# Patient Record
Sex: Female | Born: 2000 | Hispanic: Yes | Marital: Single
Health system: Southern US, Community
[De-identification: ages and names within clinical notes are randomized; demographics above are authoritative.]

---
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
## 2021-01-27 IMAGING — US US PELVIS COMPLETE WITH TRANSVAGINAL
1 series · 13 of 25 positions shown · non-contrast
Comparison: None available.

CLINICAL DATA: Initial evaluation for irregular menses with pelvic
pain for 2 months.



[Series 1: us pelvis complete with transvaginal · 13 of 73 slices shown]
[im 1/73]
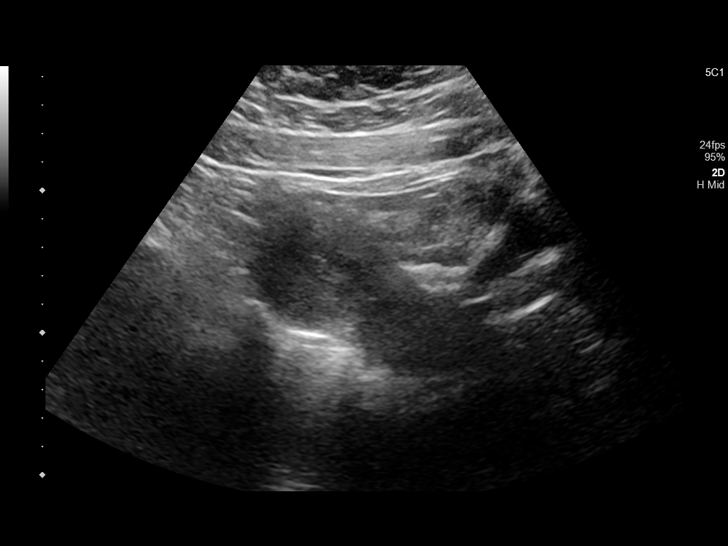
[im 7/73]
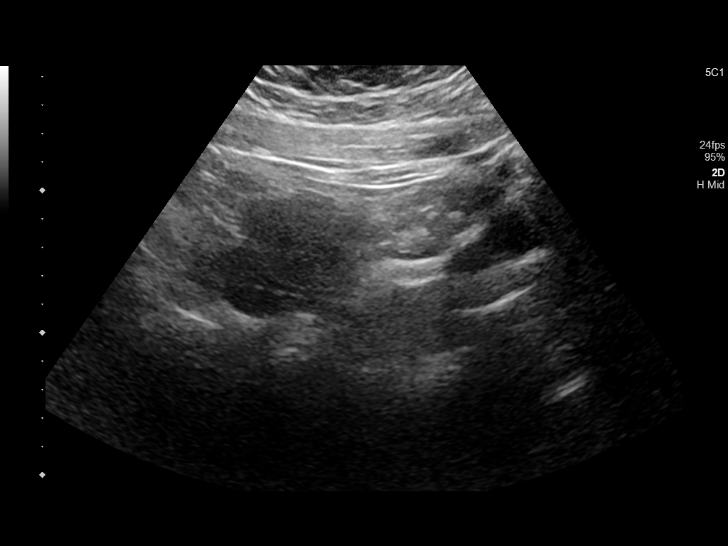
[im 13/73]
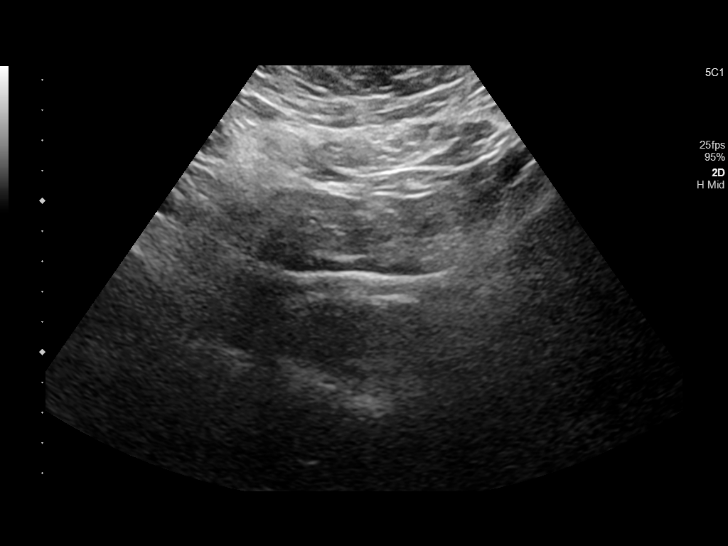
[im 19/73]
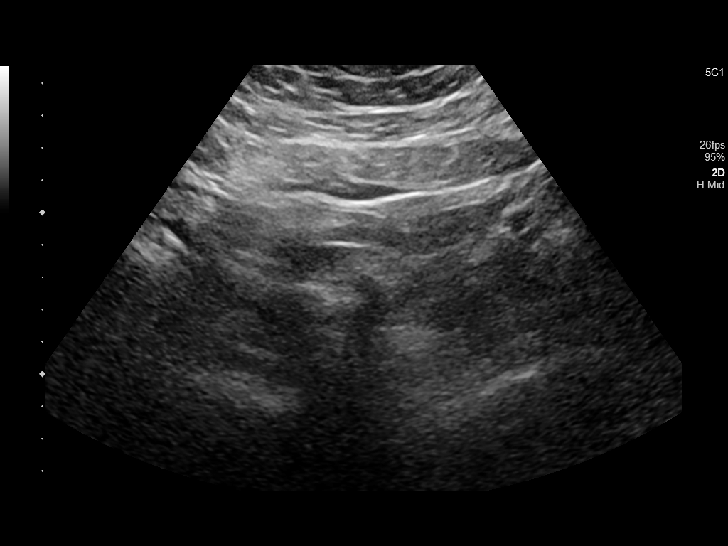
[im 25/73]
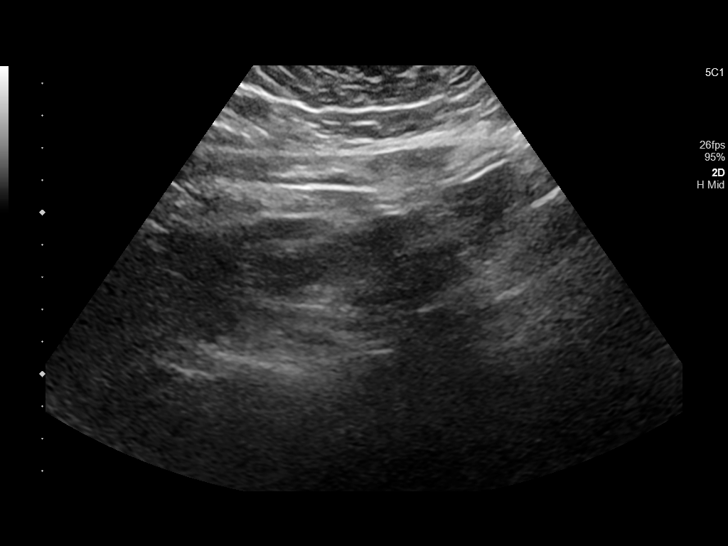
[im 31/73]
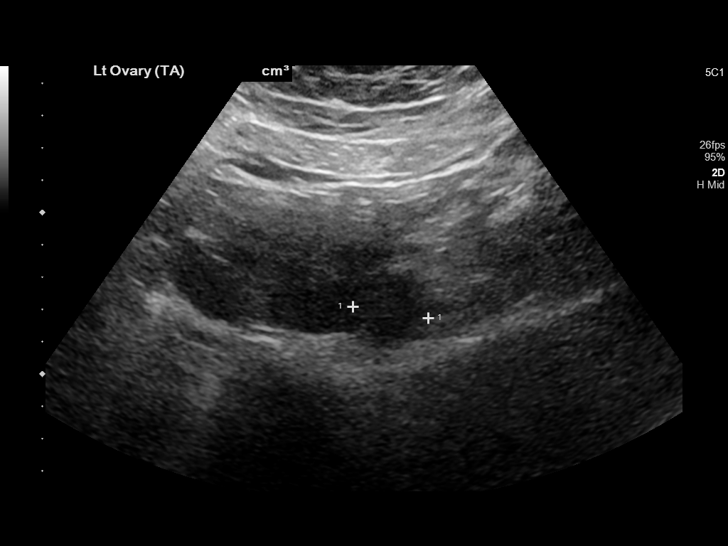
[im 37/73]
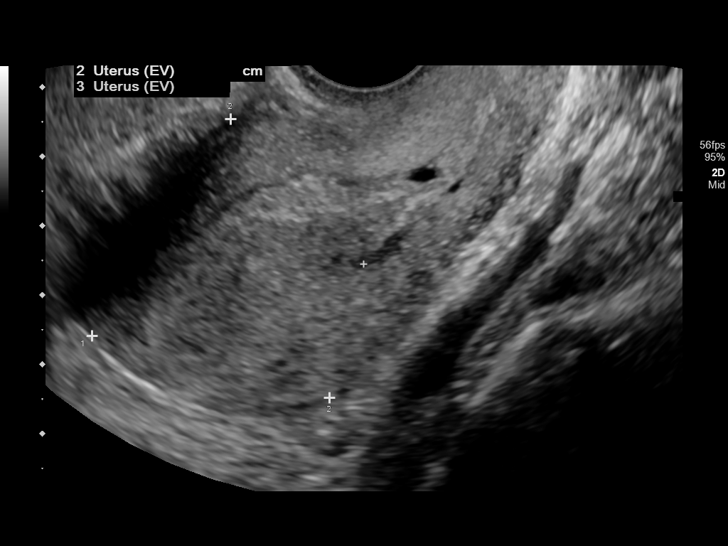
[im 43/73]
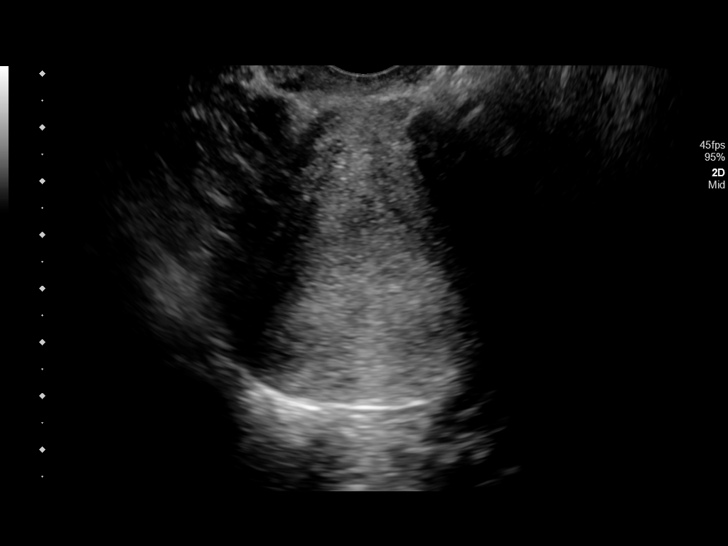
[im 49/73]
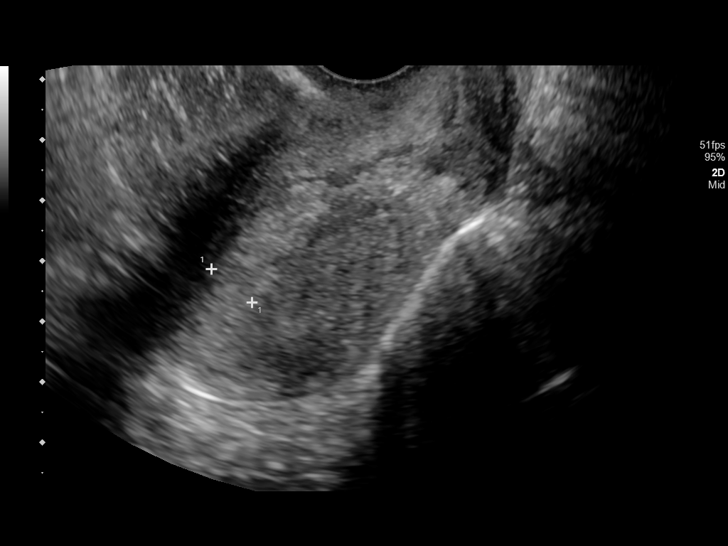
[im 55/73]
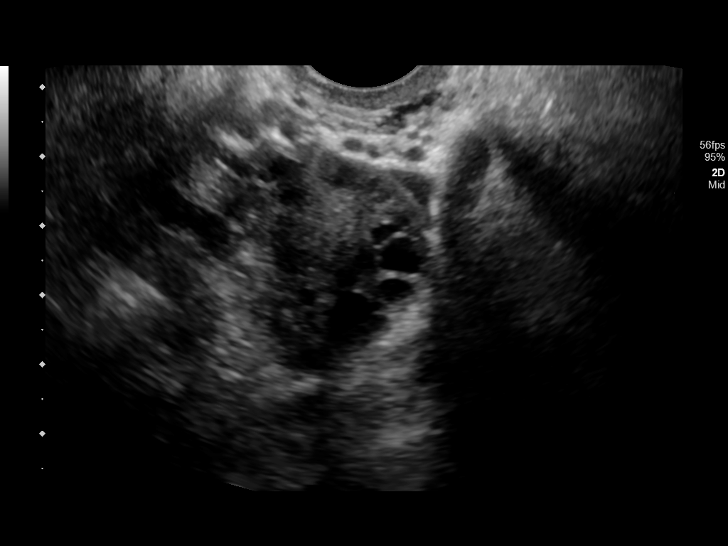
[im 61/73]
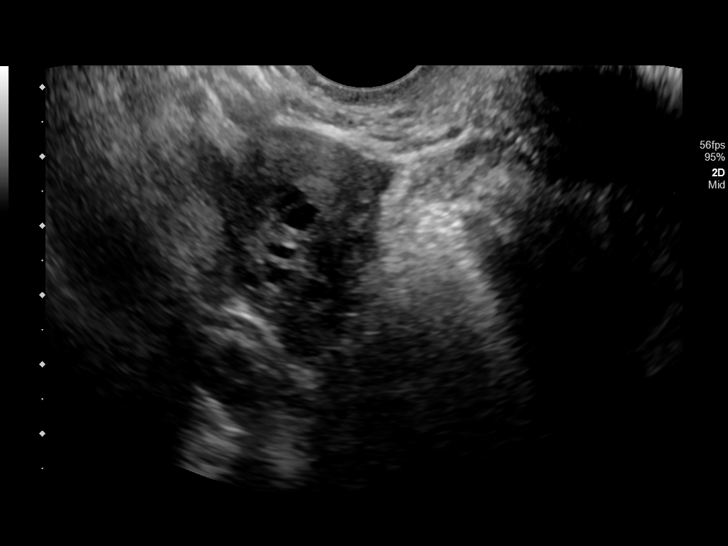
[im 67/73]
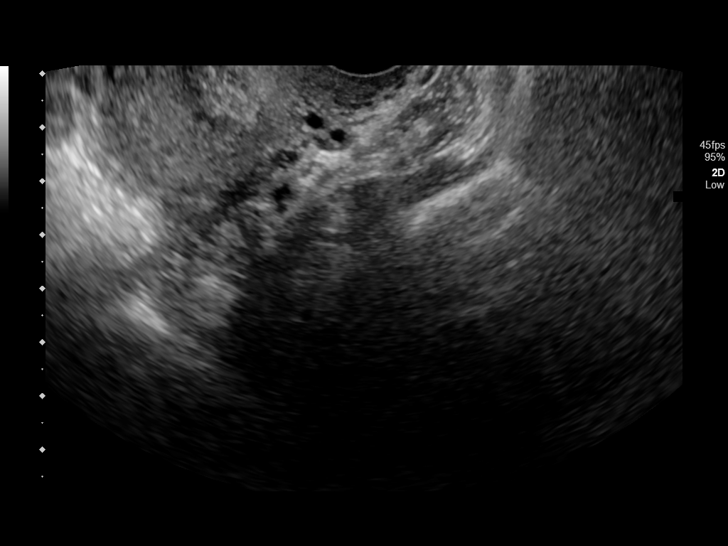
[im 73/73]
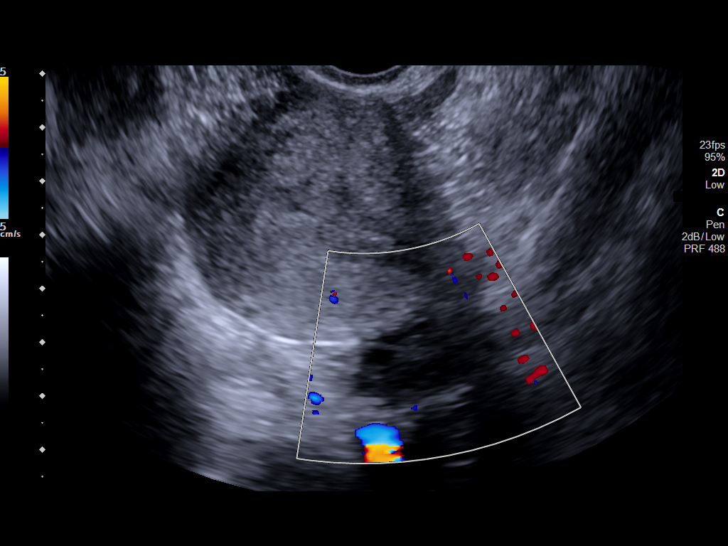

[13 of 25 positions shown; findings below may reference images not displayed]

FINDINGS: Uterus

Measurements: 8.5 x 4.1 x 5.1 cm = volume: 93.3 mL. No fibroids or
other mass visualized.

Endometrium

Thickness: 11 mm.  No focal abnormality visualized.

Right ovary

Measurements: 3.2 x 3.2 x 2.3 cm = volume: 12.3 mL. Normal
appearance/no adnexal mass.

Left ovary

Measurements: 3.6 x 2.3 x 2.4 cm = volume: 10.4 mL. Normal
appearance/no adnexal mass.

Other findings

No abnormal free fluid.
IMPRESSION: 1. Endometrial stripe measures 11 mm in thickness. If bleeding
remains unresponsive to hormonal or medical therapy, sonohysterogram
should be considered for focal lesion work-up. (Ref: Radiological
Reasoning: Algorithmic Workup of Abnormal Vaginal Bleeding with
Endovaginal Sonography and Sonohysterography. [id number]; [id number]).
2. Otherwise unremarkable and normal pelvic ultrasound.

## 2023-07-27 ENCOUNTER — Telehealth: Payer: Self-pay

## 2023-07-27 ENCOUNTER — Ambulatory Visit
Admission: EM | Admit: 2023-07-27 | Discharge: 2023-07-27 | Disposition: A | Discharge status: Home / Self Care | Payer: Self-pay | Attending: Emergency Medicine | Admitting: Emergency Medicine

## 2023-07-27 DIAGNOSIS — J09X2 Influenza due to identified novel influenza A virus with other respiratory manifestations: Secondary | ICD-10-CM

## 2023-07-27 LAB — RESP PANEL BY RT-PCR (FLU A&B, COVID) ARPGX2
Influenza A by PCR: POSITIVE — AB
Influenza B by PCR: NEGATIVE
SARS Coronavirus 2 by RT PCR: NEGATIVE

## 2023-07-27 MED ORDER — IPRATROPIUM BROMIDE 0.06 % NA SOLN: 2.0000 | Freq: Four times a day (QID) | NASAL | 12 refills | Status: DC

## 2023-07-27 MED ORDER — OSELTAMIVIR PHOSPHATE 75 MG PO CAPS: 75.0000 mg | ORAL_CAPSULE | Freq: Two times a day (BID) | ORAL | 0 refills | Status: AC

## 2023-07-27 MED ORDER — PROMETHAZINE-DM 6.25-15 MG/5ML PO SYRP: 5.0000 mL | ORAL_SOLUTION | Freq: Four times a day (QID) | ORAL | 0 refills | Status: DC | PRN

## 2023-07-27 MED ORDER — BENZONATATE 100 MG PO CAPS: 200.0000 mg | ORAL_CAPSULE | Freq: Three times a day (TID) | ORAL | 0 refills | Status: AC

## 2023-07-27 MED ORDER — BENZONATATE 100 MG PO CAPS: 200.0000 mg | ORAL_CAPSULE | Freq: Three times a day (TID) | ORAL | 0 refills | Status: DC

## 2023-07-27 MED ORDER — IPRATROPIUM BROMIDE 0.06 % NA SOLN: 2.0000 | Freq: Four times a day (QID) | NASAL | 12 refills | Status: AC

## 2023-07-27 MED ORDER — OSELTAMIVIR PHOSPHATE 75 MG PO CAPS: 75.0000 mg | ORAL_CAPSULE | Freq: Two times a day (BID) | ORAL | 0 refills | Status: DC

## 2023-07-27 MED ORDER — PROMETHAZINE-DM 6.25-15 MG/5ML PO SYRP: 5.0000 mL | ORAL_SOLUTION | Freq: Four times a day (QID) | ORAL | 0 refills | Status: AC | PRN

## 2023-07-27 NOTE — Discharge Instructions (Signed)
Take the Tamiflu twice daily for 5 days for treatment of influenza.  Use over-the-counter Tylenol and/or ibuprofen according the package instructions as needed for any fever or pain.  Use the Atrovent nasal spray, 2 squirts up each nostril every 6 hours, as needed for nasal congestion and runny nose.  Use over-the-counter Delsym, Zarbee's, or Robitussin during the day as needed for cough.  Use the Tessalon Perles every 8 hours as needed for cough.  Taken with a small sip of water.  You may experience some numbness to your tongue or metallic taste in your mouth, this is normal.  Use the Promethazine DM cough syrup at bedtime as will make you drowsy but it should help dry up your postnasal drip and aid you in sleep and cough relief.  Return for reevaluation, or see your primary care provider, for new or worsening symptoms.

## 2023-07-27 NOTE — ED Triage Notes (Signed)
Sx started yesterday  Headache Runny nose Sneezing Coughing feverish

## 2023-07-27 NOTE — ED Provider Notes (Signed)
[facility]    CSN: [id number] Arrival date & time: 07/27/23  1840      History   Chief Complaint Chief Complaint  Patient presents with   Cough   Fever   Headache   Generalized Body Aches    HPI Tara Walter is a 23 y.o. female.   HPI  23 year old female with no significant past medical history presents for evaluation of flulike symptoms that started yesterday.  These include subjective fever, headache, runny nose for a greenish clear nasal discharge, sneezing, and a cough that is only productive in the mornings for clear sputum.  She denies any sore throat, shortness breath, or wheezing.  No GI symptoms.  History reviewed. No pertinent past medical history.  There are no active problems to display for this patient.   History reviewed. No pertinent surgical history.  OB History   No obstetric history on file.      Home Medications    Prior to Admission medications   Medication Sig Start Date End Date Taking? Authorizing Provider  benzonatate (TESSALON) 100 MG capsule Take 2 capsules (200 mg total) by mouth every 8 (eight) hours. 07/27/23  Yes Becky Augusta, NP  ipratropium (ATROVENT) 0.06 % nasal spray Place 2 sprays into both nostrils 4 (four) times daily. 07/27/23  Yes Becky Augusta, NP  oseltamivir (TAMIFLU) 75 MG capsule Take 1 capsule (75 mg total) by mouth every 12 (twelve) hours. 07/27/23  Yes Becky Augusta, NP  promethazine-dextromethorphan (PROMETHAZINE-DM) 6.25-15 MG/5ML syrup Take 5 mLs by mouth 4 (four) times daily as needed. 07/27/23  Yes Becky Augusta, NP    Family History History reviewed. No pertinent family history.  Social History Social History   Tobacco Use   Smoking status: Never   Smokeless tobacco: Never  Vaping Use   Vaping status: Never Used     Allergies   Patient has no known allergies.   Review of Systems Review of Systems  Constitutional:  Positive for fever.  HENT:  Positive for congestion and rhinorrhea.  Negative for ear pain and sore throat.   Respiratory:  Positive for cough. Negative for shortness of breath and wheezing.   Gastrointestinal:  Negative for diarrhea, nausea and vomiting.  Musculoskeletal:  Positive for arthralgias and myalgias.  Neurological:  Positive for headaches.     Physical Exam Triage Vital Signs ED Triage Vitals  Encounter Vitals Group     BP 07/27/23 1908 126/87     Systolic BP Percentile --      Diastolic BP Percentile --      Pulse Rate 07/27/23 1908 (!) 104     Resp 07/27/23 1908 20     Temp 07/27/23 1908 98.9 F (37.2 C)     Temp Source 07/27/23 1908 Oral     SpO2 07/27/23 1908 97 %     Weight --      Height --      Head Circumference --      Peak Flow --      Pain Score 07/27/23 1907 5     Pain Loc --      Pain Education --      Exclude from Growth Chart --    No data found.  Updated Vital Signs BP 126/87 (BP Location: Left Arm)   Pulse (!) 104   Temp 98.9 F (37.2 C) (Oral)   Resp 20   LMP 05/09/2023   SpO2 97%   Visual Acuity Right Eye Distance:   Left Eye  Distance:   Bilateral Distance:    Right Eye Near:   Left Eye Near:    Bilateral Near:     Physical Exam Vitals and nursing note reviewed.  Constitutional:      Appearance: Normal appearance. She is ill-appearing.  HENT:     Head: Normocephalic and atraumatic.     Right Ear: Tympanic membrane, ear canal and external ear normal. There is no impacted cerumen.     Left Ear: Tympanic membrane, ear canal and external ear normal. There is no impacted cerumen.     Nose: Congestion and rhinorrhea present.     Comments: Nasal mucosa is erythematous and edematous with clear discharge in both nares.    Mouth/Throat:     Mouth: Mucous membranes are moist.     Pharynx: Oropharynx is clear. Posterior oropharyngeal erythema present. No oropharyngeal exudate.     Comments: Mild erythema to the posterior oropharynx with clear postnasal drip. Cardiovascular:     Rate and Rhythm: Normal  rate and regular rhythm.     Pulses: Normal pulses.     Heart sounds: Normal heart sounds. No murmur heard.    No friction rub. No gallop.  Pulmonary:     Effort: Pulmonary effort is normal.     Breath sounds: Normal breath sounds. No wheezing, rhonchi or rales.  Musculoskeletal:     Cervical back: Normal range of motion and neck supple. No tenderness.  Lymphadenopathy:     Cervical: No cervical adenopathy.  Skin:    General: Skin is warm and dry.     Capillary Refill: Capillary refill takes less than 2 seconds.     Findings: No erythema or rash.  Neurological:     General: No focal deficit present.     Mental Status: She is alert and oriented to person, place, and time.      UC Treatments / Results  Labs (all labs ordered are listed, but only abnormal results are displayed) Labs Reviewed  RESP PANEL BY RT-PCR (FLU A&B, COVID) ARPGX2 - Abnormal; Notable for the following components:      Result Value   Influenza A by PCR POSITIVE (*)    All other components within normal limits    EKG   Radiology No results found.  Procedures Procedures (including critical care time)  Medications Ordered in UC Medications - No data to display  Initial Impression / Assessment and Plan / UC Course  I have reviewed the triage vital signs and the nursing notes.  Pertinent labs & imaging results that were available during my care of the patient were reviewed by me and considered in my medical decision making (see chart for details).   Patient is a pleasant, though mildly ill-appearing 23 year old female presenting for evaluation of flulike symptoms that started yesterday as outlined HPI above.  Her physical exam does reveal inflammation of her upper respiratory tract as evidenced by the inflamed nasal mucosa with copious clear nasal discharge.  She reports that she did have a sore throat yesterday but has not not had 1 today.  She does have erythema to the posterior oropharynx with clear  postnasal drip.  No cervical adenopathy appreciated on exam.  Cardiopulmonary exam reveals good lung sounds in all fields.  Differential diagnosis include COVID, influenza, viral respiratory illness.  I will order a COVID and flu PCR.  Gwyndolyn Kaufman panel is positive for influenza A.  I will discharge patient home on Tamiflu 75 mg twice daily for 5 days for treatment of  influenza A.  Atrovent Nasabid help nasal congestion.  Tessalon Perles and Promethazine DM cough syrup for cough and congestion.  Over-the-counter Tylenol and/or ibuprofen as needed for fever or pain.  Return precautions reviewed.  Work note provided.   Final Clinical Impressions(s) / UC Diagnoses   Final diagnoses:  Influenza due to identified novel influenza A virus with other respiratory manifestations     Discharge Instructions      Take the Tamiflu twice daily for 5 days for treatment of influenza.  Use over-the-counter Tylenol and/or ibuprofen according the package instructions as needed for any fever or pain.  Use the Atrovent nasal spray, 2 squirts up each nostril every 6 hours, as needed for nasal congestion and runny nose.  Use over-the-counter Delsym, Zarbee's, or Robitussin during the day as needed for cough.  Use the Tessalon Perles every 8 hours as needed for cough.  Taken with a small sip of water.  You may experience some numbness to your tongue or metallic taste in your mouth, this is normal.  Use the Promethazine DM cough syrup at bedtime as will make you drowsy but it should help dry up your postnasal drip and aid you in sleep and cough relief.  Return for reevaluation, or see your primary care provider, for new or worsening symptoms.      ED Prescriptions     Medication Sig Dispense Auth. Provider   benzonatate (TESSALON) 100 MG capsule Take 2 capsules (200 mg total) by mouth every 8 (eight) hours. 21 capsule Becky Augusta, NP   ipratropium (ATROVENT) 0.06 % nasal spray Place 2 sprays into both  nostrils 4 (four) times daily. 15 mL Becky Augusta, NP   promethazine-dextromethorphan (PROMETHAZINE-DM) 6.25-15 MG/5ML syrup Take 5 mLs by mouth 4 (four) times daily as needed. 118 mL Becky Augusta, NP   oseltamivir (TAMIFLU) 75 MG capsule Take 1 capsule (75 mg total) by mouth every 12 (twelve) hours. 10 capsule Becky Augusta, NP      PDMP not reviewed this encounter.   Becky Augusta, NP 07/27/23 2005
